# Patient Record
Sex: Female | Born: 1970 | Race: White | Hispanic: No | Marital: Married | Smoking: Never smoker
Health system: Southern US, Community
[De-identification: ages and names within clinical notes are randomized; demographics above are authoritative.]

## PROBLEM LIST (undated history)

## (undated) DIAGNOSIS — I10 Essential (primary) hypertension: Secondary | ICD-10-CM

## (undated) DIAGNOSIS — M549 Dorsalgia, unspecified: Secondary | ICD-10-CM

## (undated) DIAGNOSIS — E079 Disorder of thyroid, unspecified: Secondary | ICD-10-CM

## (undated) DIAGNOSIS — E7800 Pure hypercholesterolemia, unspecified: Secondary | ICD-10-CM

## (undated) HISTORY — PX: CHOLECYSTECTOMY: SHX55

## (undated) HISTORY — PX: TONSILLECTOMY: SUR1361

---

## 2018-10-31 ENCOUNTER — Encounter (HOSPITAL_COMMUNITY): Payer: Self-pay | Admitting: Emergency Medicine

## 2018-10-31 ENCOUNTER — Emergency Department (HOSPITAL_COMMUNITY): Payer: 59

## 2018-10-31 ENCOUNTER — Emergency Department (HOSPITAL_COMMUNITY)
Admission: EM | Admit: 2018-10-31 | Discharge: 2018-10-31 | Disposition: A | Discharge status: Home / Self Care | Payer: 59 | Attending: Emergency Medicine | Admitting: Emergency Medicine

## 2018-10-31 DIAGNOSIS — F419 Anxiety disorder, unspecified: Secondary | ICD-10-CM

## 2018-10-31 DIAGNOSIS — R0602 Shortness of breath: Secondary | ICD-10-CM | POA: Diagnosis present

## 2018-10-31 DIAGNOSIS — I10 Essential (primary) hypertension: Secondary | ICD-10-CM | POA: Insufficient documentation

## 2018-10-31 DIAGNOSIS — F410 Panic disorder [episodic paroxysmal anxiety] without agoraphobia: Secondary | ICD-10-CM | POA: Diagnosis not present

## 2018-10-31 HISTORY — DX: Essential (primary) hypertension: I10

## 2018-10-31 HISTORY — DX: Dorsalgia, unspecified: M54.9

## 2018-10-31 HISTORY — DX: Pure hypercholesterolemia, unspecified: E78.00

## 2018-10-31 HISTORY — DX: Disorder of thyroid, unspecified: E07.9

## 2018-10-31 LAB — CBC WITH DIFFERENTIAL/PLATELET
Abs Immature Granulocytes: 0.02 10*3/uL (ref 0.00–0.07)
BASOS ABS: 0 10*3/uL (ref 0.0–0.1)
Basophils Relative: 1 %
Eosinophils Absolute: 0.3 10*3/uL (ref 0.0–0.5)
Eosinophils Relative: 4 %
HEMATOCRIT: 40.5 % (ref 36.0–46.0)
HEMOGLOBIN: 13.4 g/dL (ref 12.0–15.0)
Immature Granulocytes: 0 %
LYMPHS PCT: 33 %
Lymphs Abs: 2.4 10*3/uL (ref 0.7–4.0)
MCH: 30.8 pg (ref 26.0–34.0)
MCHC: 33.1 g/dL (ref 30.0–36.0)
MCV: 93.1 fL (ref 80.0–100.0)
Monocytes Absolute: 0.6 10*3/uL (ref 0.1–1.0)
Monocytes Relative: 8 %
Neutro Abs: 4 10*3/uL (ref 1.7–7.7)
Neutrophils Relative %: 54 %
Platelets: 241 10*3/uL (ref 150–400)
RBC: 4.35 MIL/uL (ref 3.87–5.11)
RDW: 12.6 % (ref 11.5–15.5)
WBC: 7.3 10*3/uL (ref 4.0–10.5)
nRBC: 0 % (ref 0.0–0.2)

## 2018-10-31 LAB — I-STAT BETA HCG BLOOD, ED (MC, WL, AP ONLY): I-stat hCG, quantitative: <5 m[IU]/mL (ref <5)

## 2018-10-31 LAB — BASIC METABOLIC PANEL
Anion gap: 13 (ref 5–15)
BUN: 18 mg/dL (ref 6–20)
CO2: 23 mmol/L (ref 22–32)
Calcium: 9.3 mg/dL (ref 8.9–10.3)
Chloride: 102 mmol/L (ref 98–111)
Creatinine, Ser: 0.95 mg/dL (ref 0.44–1.00)
GFR calc non Af Amer: >60 mL/min (ref >60)
Glucose, Bld: 118 mg/dL — ABNORMAL HIGH (ref 70–99)
Potassium: 3.6 mmol/L (ref 3.5–5.1)
Sodium: 138 mmol/L (ref 135–145)

## 2018-10-31 LAB — I-STAT TROPONIN, ED
Troponin i, poc: 0.01 ng/mL (ref 0.00–0.08)
Troponin i, poc: 0 ng/mL (ref 0.00–0.08)

## 2018-10-31 MED ORDER — IOPAMIDOL (ISOVUE-370) INJECTION 76%
INTRAVENOUS | Status: AC
  Administered 2018-10-31: 02:00:00
  Filled 2018-10-31: qty 100

## 2018-10-31 MED ORDER — IOPAMIDOL (ISOVUE-370) INJECTION 76%
100.0000 mL | Freq: Once | INTRAVENOUS | Status: AC | PRN
  Administered 2018-10-31: 100 mL via INTRAVENOUS

## 2018-10-31 NOTE — ED Provider Notes (Signed)
[facility]   CSN: [id number] Arrival date & time: 10/31/18  0106     History   Chief Complaint Chief Complaint  Patient presents with  . Panic Attack    HPI Gail Taylor is a 48 y.o. female.  The history is provided by the patient.  Shortness of Breath  Severity:  Moderate Onset quality:  Sudden Timing:  Constant Progression:  Unchanged Chronicity:  Recurrent Context: not activity, not animal exposure, not emotional upset, not fumes, not known allergens, not occupational exposure, not pollens, not smoke exposure, not strong odors, not URI and not weather changes   Relieved by:  Nothing Worsened by:  Nothing Ineffective treatments:  None tried Associated symptoms: no abdominal pain, no chest pain, no claudication, no cough, no diaphoresis, no ear pain, no fever, no headaches, no hemoptysis, no neck pain, no PND, no rash, no sore throat, no sputum production, no syncope, no swollen glands, no vomiting and no wheezing   Risk factors: no hx of PE/DVT   Patient with a h/o HTN and cholesterol presents with SOB 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  No Cough.  No f/c/r.    Past Medical History:  Diagnosis Date  . Back pain   . Hypercholesteremia   . Hypertension   . Thyroid disease     There are no active problems to display for this patient.   Past Surgical History:  Procedure Laterality Date  . CHOLECYSTECTOMY    . TONSILLECTOMY       OB History   No obstetric history on file.      Home Medications    Prior to Admission medications   Not on File    Family History No family history on file.  Social History Social History   Tobacco Use  . Smoking status: Never Smoker  . Smokeless tobacco: Never Used  Substance Use Topics  . Alcohol use: Not on file  . Drug use: Not on file     Allergies   Tramadol   Review of Systems Review of Systems  Constitutional: Negative for diaphoresis and  fever.  HENT: Negative for ear pain and sore throat.   Respiratory: Positive for shortness of breath. Negative for cough, hemoptysis, sputum production and wheezing.   Cardiovascular: Negative for chest pain, palpitations, claudication, leg swelling, syncope and PND.  Gastrointestinal: Negative for abdominal pain, nausea and vomiting.  Genitourinary: Negative for flank pain.  Musculoskeletal: Negative for neck pain.  Skin: Negative for rash.  Neurological: Negative for headaches.  All other systems reviewed and are negative.    Physical Exam Updated Vital Signs BP (!) 112/52   Pulse 62   Temp 98.3 F (36.8 C) (Oral)   Resp 13   Wt (!) 153.3 kg   SpO2 96%   Physical Exam Vitals signs and nursing note reviewed.  Constitutional:      General: She is not in acute distress.    Appearance: She is obese. She is not ill-appearing, toxic-appearing or diaphoretic.  HENT:     Head: Normocephalic and atraumatic.     Nose: Nose normal.     Mouth/Throat:     Mouth: Mucous membranes are moist.     Pharynx: Oropharynx is clear.  Eyes:     Conjunctiva/sclera: Conjunctivae normal.     Pupils: Pupils are equal, round, and reactive to light.  Neck:     Musculoskeletal: Normal range of motion and neck supple.  Cardiovascular:  Rate and Rhythm: Normal rate and regular rhythm.     Pulses: Normal pulses.     Heart sounds: Normal heart sounds.  Pulmonary:     Effort: Pulmonary effort is normal. No respiratory distress.     Breath sounds: No stridor. No wheezing, rhonchi or rales.  Chest:     Chest wall: No tenderness.  Abdominal:     General: Abdomen is flat. Bowel sounds are normal.     Tenderness: There is no abdominal tenderness. There is no guarding.  Musculoskeletal: Normal range of motion.     Right lower leg: No edema.     Left lower leg: No edema.  Skin:    General: Skin is warm and dry.     Capillary Refill: Capillary refill takes less than 2 seconds.  Neurological:      General: No focal deficit present.     Mental Status: She is alert and oriented to person, place, and time.  Psychiatric:        Mood and Affect: Mood is anxious.      ED Treatments / Results  Labs (all labs ordered are listed, but only abnormal results are displayed) Results for orders placed or performed during the hospital encounter of 10/31/18  CBC with Differential/Platelet  Result Value Ref Range   WBC 7.3 4.0 - 10.5 K/uL   RBC 4.35 3.87 - 5.11 MIL/uL   Hemoglobin 13.4 12.0 - 15.0 g/dL   HCT 69.6 29.5 - 28.4 %   MCV 93.1 80.0 - 100.0 fL   MCH 30.8 26.0 - 34.0 pg   MCHC 33.1 30.0 - 36.0 g/dL   RDW 13.2 44.0 - 10.2 %   Platelets 241 150 - 400 K/uL   nRBC 0.0 0.0 - 0.2 %   Neutrophils Relative % 54 %   Neutro Abs 4.0 1.7 - 7.7 K/uL   Lymphocytes Relative 33 %   Lymphs Abs 2.4 0.7 - 4.0 K/uL   Monocytes Relative 8 %   Monocytes Absolute 0.6 0.1 - 1.0 K/uL   Eosinophils Relative 4 %   Eosinophils Absolute 0.3 0.0 - 0.5 K/uL   Basophils Relative 1 %   Basophils Absolute 0.0 0.0 - 0.1 K/uL   Immature Granulocytes 0 %   Abs Immature Granulocytes 0.02 0.00 - 0.07 K/uL  Basic metabolic panel  Result Value Ref Range   Sodium 138 135 - 145 mmol/L   Potassium 3.6 3.5 - 5.1 mmol/L   Chloride 102 98 - 111 mmol/L   CO2 23 22 - 32 mmol/L   Glucose, Bld 118 (H) 70 - 99 mg/dL   BUN 18 6 - 20 mg/dL   Creatinine, Ser 7.25 0.44 - 1.00 mg/dL   Calcium 9.3 8.9 - 36.6 mg/dL   GFR calc non Af Amer >60 >60 mL/min   GFR calc Af Amer >60 >60 mL/min   Anion gap 13 5 - 15  I-Stat Beta hCG blood, ED ([facility], [facility], [facility] only)  Result Value Ref Range   I-stat hCG, quantitative <5.0 <5 mIU/mL   Comment 3          I-stat troponin, ED  Result Value Ref Range   Troponin i, poc 0.01 0.00 - 0.08 ng/mL   Comment 3           Ct Angio Chest Pe W And/or Wo Contrast  Result Date: 10/31/2018 CLINICAL DATA:  Shortness of breath EXAM: CT ANGIOGRAPHY CHEST WITH CONTRAST TECHNIQUE: Multidetector CT imaging  of the chest was performed  using the standard protocol during bolus administration of intravenous contrast. Multiplanar CT image reconstructions and MIPs were obtained to evaluate the vascular anatomy. CONTRAST:  100mL ISOVUE-370 IOPAMIDOL (ISOVUE-370) INJECTION 76% COMPARISON:  None. FINDINGS: Cardiovascular: No filling defects in the pulmonary arteries to suggest pulmonary emboli. Heart is normal size. Aorta is normal caliber. Extensive calcifications within the left anterior descending coronary artery and left circumflex coronary artery. Mediastinum/Nodes: No mediastinal, hilar, or axillary adenopathy. Lungs/Pleura: Lungs are clear. No focal airspace opacities or suspicious nodules. No effusions. Upper Abdomen: Suspect fatty infiltration of the liver. Prior cholecystectomy. Musculoskeletal: Chest wall soft tissues are unremarkable. No acute bony abnormality. Review of the MIP images confirms the above findings. IMPRESSION: No evidence of pulmonary embolus. Coronary artery calcifications in the left anterior descending and left circumflex coronary arteries. Fatty infiltration of the liver. No acute cardiopulmonary disease. Electronically Signed   By: Charlett NoseKevin  Dover M.D.   On: 10/31/2018 02:34    EKG EKG Interpretation  Date/Time:  Thursday October 31 2018 01:06:37 EST Ventricular Rate:  56 PR Interval:    QRS Duration: 90 QT Interval:  465 QTC Calculation: 449 R Axis:   40 Text Interpretation:  Sinus rhythm Confirmed by Nicanor AlconPalumbo, Serenity Batley ([id number]) on 10/31/2018 2:24:19 AM   Radiology Ct Angio Chest Pe W And/or Wo Contrast  Result Date: 10/31/2018 CLINICAL DATA:  Shortness of breath EXAM: CT ANGIOGRAPHY CHEST WITH CONTRAST TECHNIQUE: Multidetector CT imaging of the chest was performed using the standard protocol during bolus administration of intravenous contrast. Multiplanar CT image reconstructions and MIPs were obtained to evaluate the vascular anatomy. CONTRAST:  100mL ISOVUE-370 IOPAMIDOL  (ISOVUE-370) INJECTION 76% COMPARISON:  None. FINDINGS: Cardiovascular: No filling defects in the pulmonary arteries to suggest pulmonary emboli. Heart is normal size. Aorta is normal caliber. Extensive calcifications within the left anterior descending coronary artery and left circumflex coronary artery. Mediastinum/Nodes: No mediastinal, hilar, or axillary adenopathy. Lungs/Pleura: Lungs are clear. No focal airspace opacities or suspicious nodules. No effusions. Upper Abdomen: Suspect fatty infiltration of the liver. Prior cholecystectomy. Musculoskeletal: Chest wall soft tissues are unremarkable. No acute bony abnormality. Review of the MIP images confirms the above findings. IMPRESSION: No evidence of pulmonary embolus. Coronary artery calcifications in the left anterior descending and left circumflex coronary arteries. Fatty infiltration of the liver. No acute cardiopulmonary disease. Electronically Signed   By: Charlett NoseKevin  Dover M.D.   On: 10/31/2018 02:34    Procedures Procedures (including critical care time)  Medications Ordered in ED Medications  iopamidol (ISOVUE-370) 76 % injection (  Contrast Given 10/31/18 0215)  iopamidol (ISOVUE-370) 76 % injection 100 mL (100 mLs Intravenous Contrast Given 10/31/18 0215)     Ruled out for MI in the ED with negative EKG and 2 negative troponins.  HEART score is 2, no stents on recent cath, ruled out for PE.  Likely panic vs GERD.  Follow up with your PMD for ongoing care.    Final Clinical Impressions(s) / ED Diagnoses   Return for pain, intractable cough, fevers >100.4 unrelieved by medication, shortness of breath, intractable vomiting, chest pain, shortness of breath, weakness numbness, changes in speech, facial asymmetry,abdominal pain, passing out,Inability to tolerate liquids or food, cough, altered mental status or any concerns. No signs of systemic illness or infection. The patient is nontoxic-appearing on exam and vital signs are within normal  limits.   I have reviewed the triage vital signs and the nursing notes. Pertinent labs &imaging results that were available during my care of the patient  were reviewed by me and considered in my medical decision making (see chart for details).  After history, exam, and medical workup I feel the patient has been appropriately medically screened and is safe for discharge home. Pertinent diagnoses were discussed with the patient. Patient was given return precautions.   Jace Dowe, MD 10/31/18 [phone]

## 2018-10-31 NOTE — ED Triage Notes (Signed)
Pt arrived EMS from work for reports of SOB, per EMS pt reports her co-workers say she is normally very anxious but pt denies anxiety.EMS states that her SOB was resolved with breathing coaching. pt also reported dizziness upon standing. Asymptomatic at this time. VSS with EMS BP 126/palp P 73 RR 20 O2 97

## 2018-10-31 NOTE — ED Notes (Signed)
Abdominal Circumference 65inches

## 2018-10-31 NOTE — ED Notes (Signed)
E-SIGNATURE NOT AVAILABLE, PT VERBALIZED UNDERSTANDING OF DC INSTRUCTIONS. AMBULATORY.

## 2018-10-31 NOTE — ED Notes (Signed)
Patient transported to CT 

## 2020-08-08 IMAGING — CT CT ANGIO CHEST
2 of 7 series · 18 of 46 positions shown · IV contrast (APPLIED)
Comparison: None.

CLINICAL DATA: Shortness of breath

EXAM:
CT ANGIOGRAPHY CHEST WITH CONTRAST
TECHNIQUE: Multidetector CT imaging of the chest was performed using the
standard protocol during bolus administration of intravenous
contrast. Multiplanar CT image reconstructions and MIPs were
obtained to evaluate the vascular anatomy.
CONTRAST:  100mL QFP5ZD-SCE IOPAMIDOL (QFP5ZD-SCE) INJECTION 76%

[Series 7: thins · axial · 0.96mm/px · z∈[+88,+365]mm · 15 of 446 slices shown]
[im 25/446  lung]
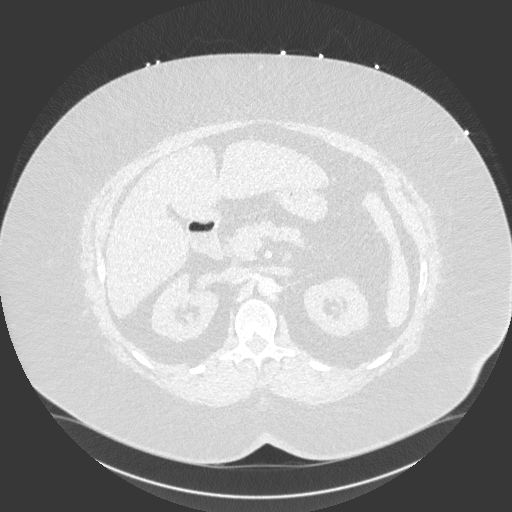
[im 50/446  soft-tissue]
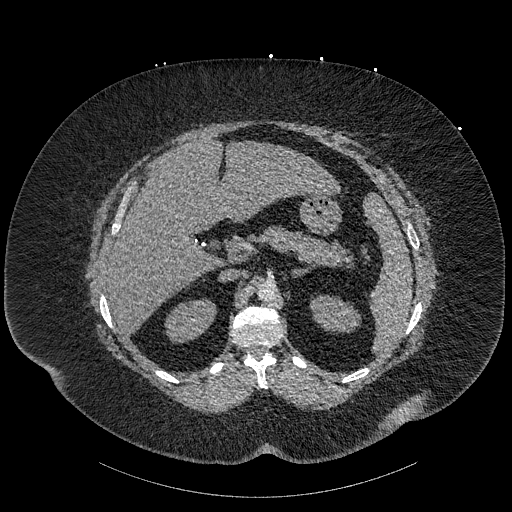
[im 75/446  lung]
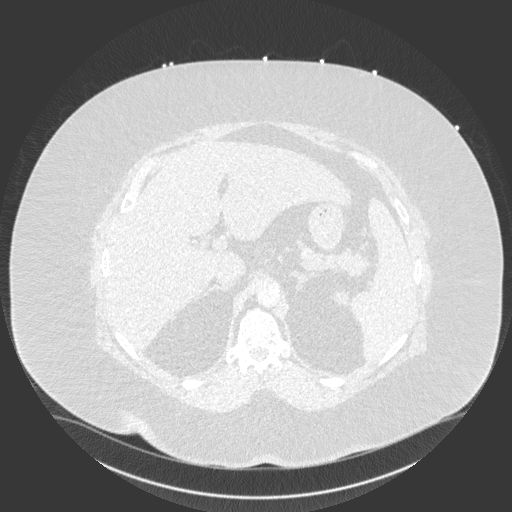
[im 99/446  soft-tissue]
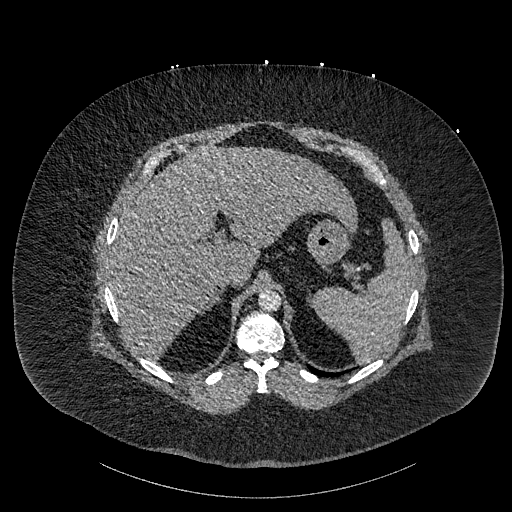
[im 149/446  lung]
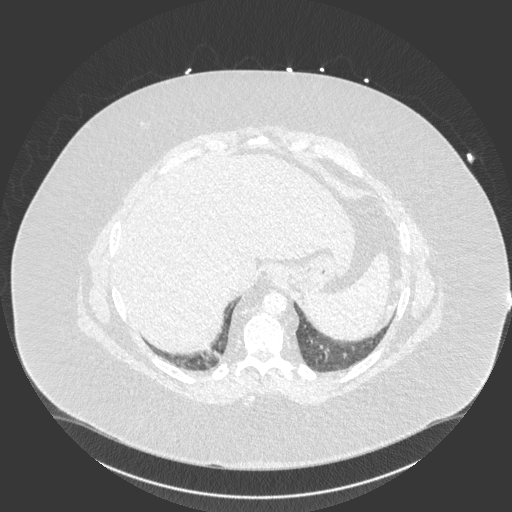
[im 174/446  soft-tissue]
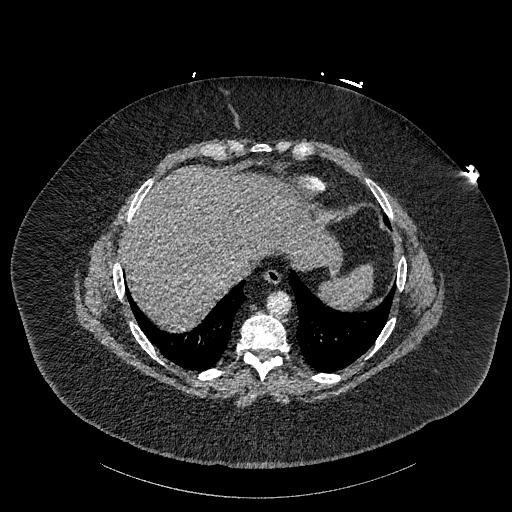
[im 198/446  lung]
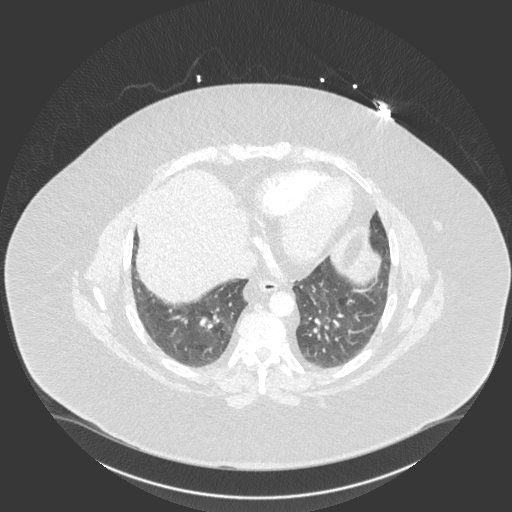
[im 223/446  soft-tissue]
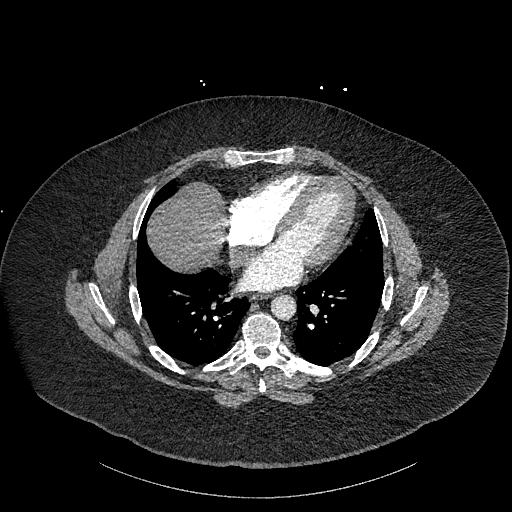
[im 248/446  lung]
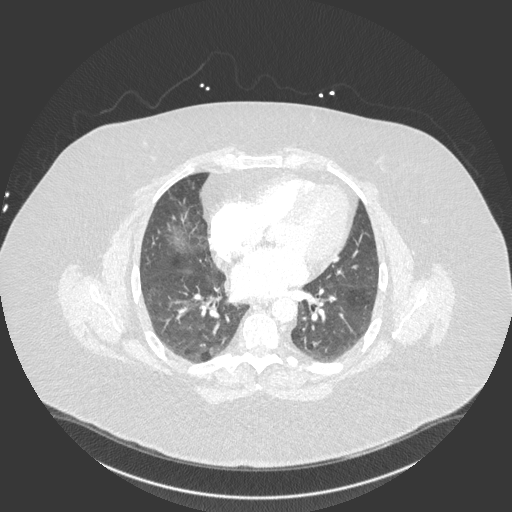
[im 272/446  soft-tissue]
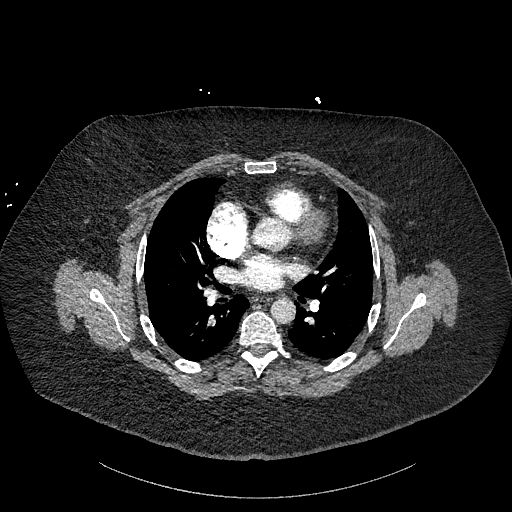
[im 297/446  lung]
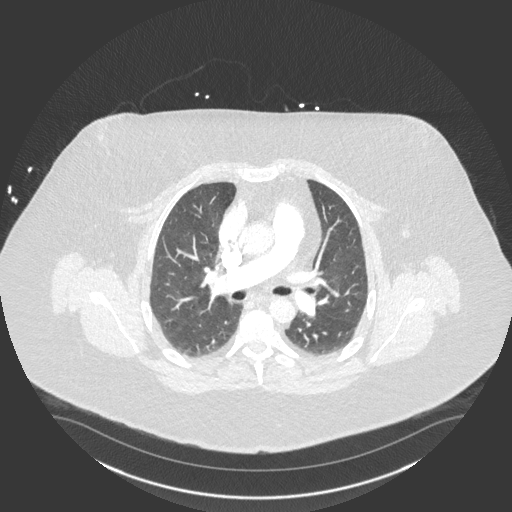
[im 347/446  soft-tissue]
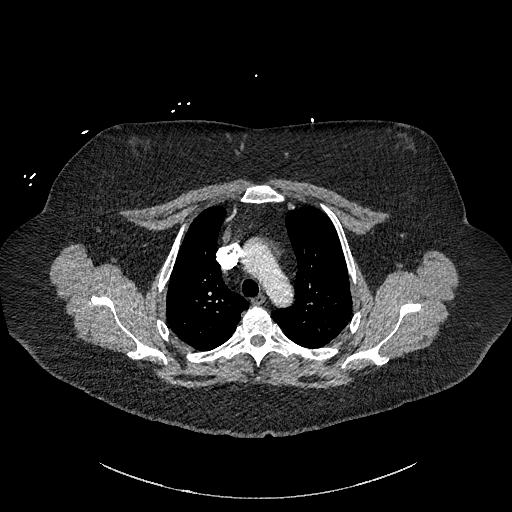
[im 371/446  lung]
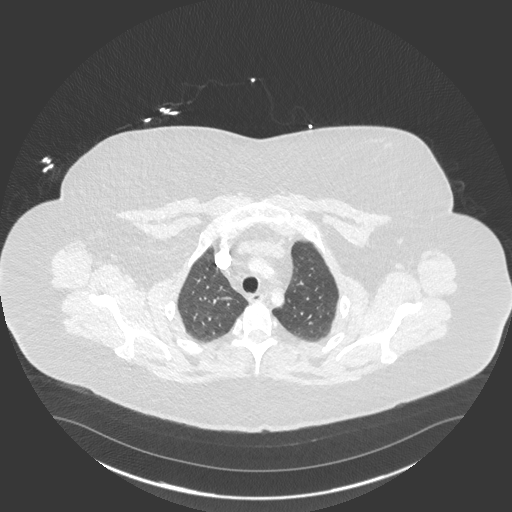
[im 396/446  soft-tissue]
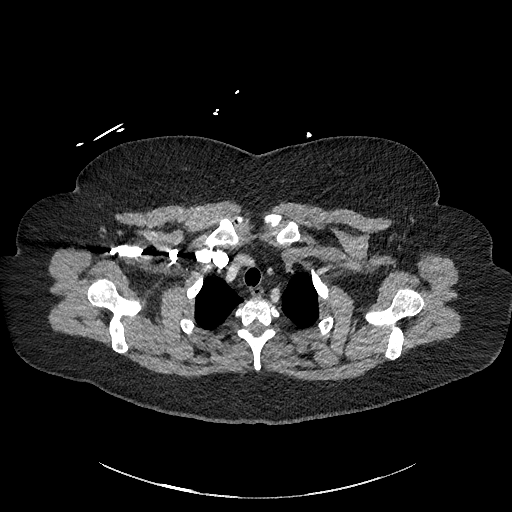
[im 421/446  lung]
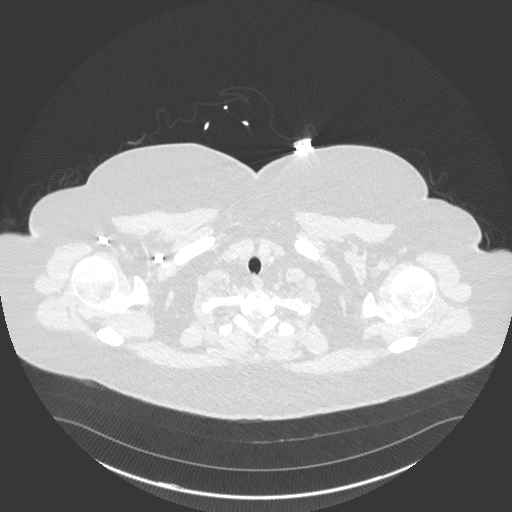

[Series 8: cor · coronal · 0.61mm/px · 3 of 188 slices shown]
[im 47/188  soft-tissue]
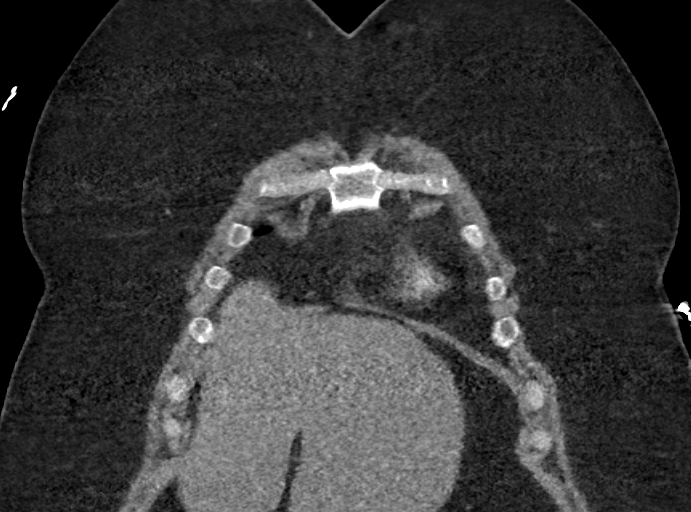
[im 94/188  soft-tissue]
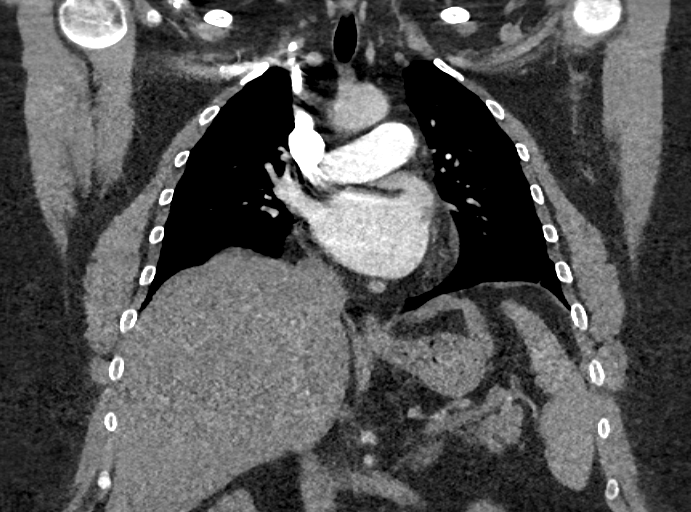
[im 141/188  soft-tissue]
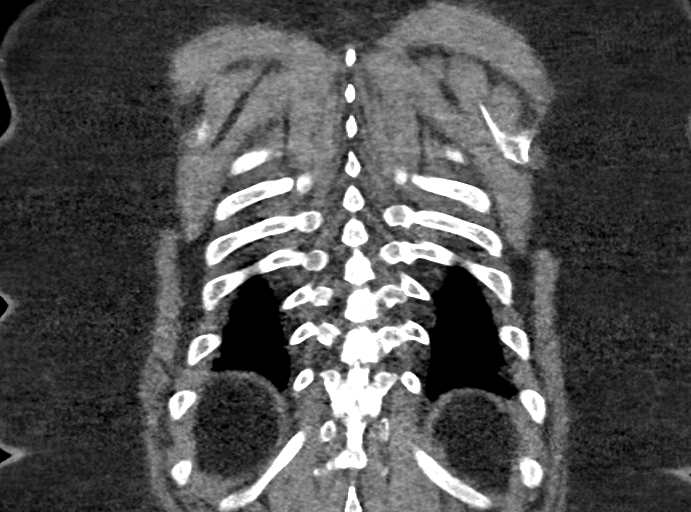

[18 of 46 positions shown; findings below may reference images not displayed]

FINDINGS: Cardiovascular: No filling defects in the pulmonary arteries to
suggest pulmonary emboli. Heart is normal size. Aorta is normal
caliber. Extensive calcifications within the left anterior
descending coronary artery and left circumflex coronary artery.

Mediastinum/Nodes: No mediastinal, hilar, or axillary adenopathy.

Lungs/Pleura: Lungs are clear. No focal airspace opacities or
suspicious nodules. No effusions.

Upper Abdomen: Suspect fatty infiltration of the liver. Prior
cholecystectomy.

Musculoskeletal: Chest wall soft tissues are unremarkable. No acute
bony abnormality.

Review of the MIP images confirms the above findings.
IMPRESSION: No evidence of pulmonary embolus.

Coronary artery calcifications in the left anterior descending and
left circumflex coronary arteries.

Fatty infiltration of the liver.

No acute cardiopulmonary disease.

## 2021-02-02 ENCOUNTER — Other Ambulatory Visit (HOSPITAL_BASED_OUTPATIENT_CLINIC_OR_DEPARTMENT_OTHER): Payer: Self-pay

## 2021-02-02 MED ORDER — HYDROCODONE-ACETAMINOPHEN 5-325 MG PO TABS
ORAL_TABLET | ORAL | 0 refills | Status: AC
  Filled 2021-02-02: qty 90, 30d supply, fill #0

## 2021-02-09 ENCOUNTER — Other Ambulatory Visit (HOSPITAL_BASED_OUTPATIENT_CLINIC_OR_DEPARTMENT_OTHER): Payer: Self-pay
